# Patient Record
Sex: Male | Born: 1965 | Race: White | Hispanic: No | Marital: Single | Smoking: Current every day smoker
Health system: Southern US, Community
[De-identification: ages and names within clinical notes are randomized; demographics above are authoritative.]

## PROBLEM LIST (undated history)

## (undated) DIAGNOSIS — F329 Major depressive disorder, single episode, unspecified: Secondary | ICD-10-CM

## (undated) DIAGNOSIS — F32A Depression, unspecified: Secondary | ICD-10-CM

## (undated) HISTORY — PX: TOE SURGERY: SHX1073

## (undated) HISTORY — PX: HAND SURGERY: SHX662

---

## 2016-04-07 ENCOUNTER — Emergency Department (HOSPITAL_COMMUNITY)
Admission: EM | Admit: 2016-04-07 | Discharge: 2016-04-07 | Disposition: A | Discharge status: Home / Self Care | Payer: Self-pay | Attending: Emergency Medicine | Admitting: Emergency Medicine

## 2016-04-07 ENCOUNTER — Encounter (HOSPITAL_COMMUNITY): Payer: Self-pay

## 2016-04-07 ENCOUNTER — Observation Stay (HOSPITAL_COMMUNITY)
Admission: RE | Admit: 2016-04-07 | Discharge: 2016-04-09 | Disposition: A | Discharge status: Home / Self Care | Payer: Medicare Other | Source: Intra-hospital | Attending: Psychiatry | Admitting: Psychiatry

## 2016-04-07 DIAGNOSIS — Z79899 Other long term (current) drug therapy: Secondary | ICD-10-CM | POA: Diagnosis not present

## 2016-04-07 DIAGNOSIS — F17210 Nicotine dependence, cigarettes, uncomplicated: Secondary | ICD-10-CM | POA: Insufficient documentation

## 2016-04-07 DIAGNOSIS — F17200 Nicotine dependence, unspecified, uncomplicated: Secondary | ICD-10-CM | POA: Insufficient documentation

## 2016-04-07 DIAGNOSIS — F259 Schizoaffective disorder, unspecified: Secondary | ICD-10-CM

## 2016-04-07 DIAGNOSIS — F250 Schizoaffective disorder, bipolar type: Secondary | ICD-10-CM | POA: Diagnosis not present

## 2016-04-07 DIAGNOSIS — F1490 Cocaine use, unspecified, uncomplicated: Secondary | ICD-10-CM | POA: Insufficient documentation

## 2016-04-07 DIAGNOSIS — F4321 Adjustment disorder with depressed mood: Secondary | ICD-10-CM | POA: Diagnosis not present

## 2016-04-07 DIAGNOSIS — F1290 Cannabis use, unspecified, uncomplicated: Secondary | ICD-10-CM | POA: Insufficient documentation

## 2016-04-07 DIAGNOSIS — F419 Anxiety disorder, unspecified: Secondary | ICD-10-CM | POA: Insufficient documentation

## 2016-04-07 HISTORY — DX: Depression, unspecified: F32.A

## 2016-04-07 HISTORY — DX: Major depressive disorder, single episode, unspecified: F32.9

## 2016-04-07 MED ORDER — HYDROXYZINE HCL 50 MG PO TABS
50.0000 mg | ORAL_TABLET | Freq: Four times a day (QID) | ORAL | Status: DC | PRN
  Administered 2016-04-08 (×2): 50 mg via ORAL
  Filled 2016-04-07 (×2): qty 1

## 2016-04-07 MED ORDER — TRAZODONE HCL 50 MG PO TABS
50.0000 mg | ORAL_TABLET | Freq: Every evening | ORAL | Status: DC | PRN
  Administered 2016-04-08 (×2): 50 mg via ORAL
  Filled 2016-04-07 (×2): qty 1

## 2016-04-07 MED ORDER — ACETAMINOPHEN 325 MG PO TABS: 650.0000 mg | ORAL_TABLET | Freq: Four times a day (QID) | ORAL | Status: DC | PRN

## 2016-04-07 MED ORDER — ALUM & MAG HYDROXIDE-SIMETH 200-200-20 MG/5ML PO SUSP: 30.0000 mL | ORAL | Status: DC | PRN

## 2016-04-07 MED ORDER — MAGNESIUM HYDROXIDE 400 MG/5ML PO SUSP: 30.0000 mL | Freq: Every day | ORAL | Status: DC | PRN

## 2016-04-07 NOTE — BH Assessment (Signed)
Tele Assessment Note   Tyrone Ballard is an 50 y.o. male who [facility]. 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 Pt reports he "talks to himself" when others are not in the room but pt reports he does not have hallucinations. During the assessment, pt continued to experience a flight of ideas as he talked about wanting to find a wife or a girlfriend. Pt asked "if I meet a hot girl in the hot tub and my friend is there, why do I want to be there if my friend is there?" Pt continued to speak in rambling thoughts as he stated "I watch porn and I go to gentleman clubs, I like multiple girls" when the pt was asked questions regarding why he came to the hospital today. Pt continued to express a desire to find a girlfriend and stated "I just want my phone to ring so girls can call me."  Pt states his nephew was shot this year and when asked about H/I pt stated "sometimes I think about it but I don't know if I am serious." Pt denies prior inpatient therapy and states he is not currently seeing an OPT therapist but reports he has in the past [place]. Pt denies regular drug use and states he occassionally smokes marijuana and drinks beers. 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   Per Donell SievertSpencer Simon, PA pt meets criteria for OBS.   Diagnosis: Schizoaffective   Past Medical History:  Past Medical History:  Diagnosis Date   Depression     Past Surgical History:  Procedure Laterality Date   HAND SURGERY     TOE SURGERY      Family  History: No family history on file.  Social History:  reports that he has been smoking.  He has never used smokeless tobacco. He reports that he drinks alcohol. He reports that he uses drugs, including Marijuana and Cocaine.  Additional Social History:  Alcohol / Drug Use Pain Medications: Pt denies abuse  Prescriptions: Pt denies abuse  Over the Counter: Pt denies abuse  History of alcohol / drug use?: Yes Longest period of sobriety (when/how long): (P) unknown Substance #1 Name of Substance 1: Alcohol 1 - Age of First Use: pt reports "I had my first beer when the yankees won the championship" 1 - Amount (size/oz): pt reports "1 beer" 1 - Frequency: "not often" 1 - Duration: unknown 1 - Last Use / Amount: pt reports "last week" Substance #2 Name of Substance 2: Marijuana 2 - Age of First Use: pt stated "as an adult" 2 - Amount (size/oz): "1 or 2 puffs, I can smoke a blunt with my niece and nephew." 2 - Frequency: unknown 2 - Duration: unknown 2 - Last Use / Amount: "last Monday"  CIWA:   COWS:    PATIENT STRENGTHS: (choose at least two) Active sense of humor Financial means Supportive family/friends  Allergies: No Known Allergies  Home Medications:  No prescriptions prior to admission.    OB/GYN Status:  No LMP for male patient.  General Assessment Data Location of Assessment: [facility] Assessment Services TTS Assessment: In system Is this a Tele or Face-to-Face Assessment?: Face-to-Face Is this an Initial Assessment or a Re-assessment for this encounter?: Initial Assessment Marital status: Single Is patient pregnant?: No Pregnancy Status: No Living Arrangements: Parent, Other relatives Can pt return to current living arrangement?: Yes Admission Status: Voluntary Is patient capable of signing voluntary admission?: Yes Referral Source: Self/Family/Friend Insurance type: Medicare  Medical Screening Exam [facility] Walk-in ONLY) Medical Exam completed: Yes  Crisis Care  Plan Living Arrangements: Parent, Other relatives Legal Guardian:  (unknown, pt experiencing flight of ideas) Name of Psychiatrist: none provided, pt states he had one in [place] in 2015 Name of Therapist: none  Education Status Is patient currently in school?: No Highest grade of school patient has completed: 12th  Risk to self with the past 6 months Suicidal Ideation: No Has patient been a risk to self within the past 6 months prior to admission? : No Suicidal Intent: No Has patient had any suicidal intent within the past 6 months prior to admission? : No Is patient at risk for suicide?: No Suicidal Plan?: No Has patient had any suicidal plan within the past 6 months prior to admission? : No Access to Means: No What has been your use of drugs/alcohol within the last 12 months?: pt reports last week he used marijuana and drank a beer Previous Attempts/Gestures: No Triggers for Past Attempts: None known Intentional Self Injurious Behavior: Cutting Comment - Self Injurious Behavior: pt reports he used to be a cutter "years ago" Family Suicide History: Unknown Recent stressful life event(s): Other (Comment) (pt reports he "wants a wife") Persecutory voices/beliefs?: No Depression: No Depression Symptoms:  (denies) Substance abuse history and/or treatment for substance abuse?: No Suicide prevention information given to non-admitted patients: Not applicable  Risk to Others within the past 6 months Homicidal Ideation: No Does patient have any lifetime risk of violence toward others beyond the six months prior to admission? : No Thoughts of Harm to Others: No Current Homicidal Intent: No Current Homicidal Plan: No Access to Homicidal Means: No History of harm to others?: No Assessment of Violence: None Noted Does patient have access to weapons?: No Criminal Charges Pending?: No Does patient have a court date: No Is patient on probation?: No  Psychosis Hallucinations: None  noted Delusions: Unspecified  Mental Status Report Appearance/Hygiene: Bizarre, Disheveled Eye Contact: Good Motor Activity: Freedom of movement, Hyperactivity Speech: Loud, Rapid, Word salad, Tangential Level of Consciousness: Alert Mood: Pleasant, Anxious (pt was kind and using polite terms including "thank you") Affect: Anxious (pt continued to talk about "wanting a girlfriend/wife") Anxiety Level: Minimal Thought Processes: Flight of Ideas, Tangential, Irrelevant Judgement: Impaired Orientation: Time, Place, Person Obsessive Compulsive Thoughts/Behaviors: None  Cognitive Functioning Concentration: Decreased Memory: Recent Intact, Remote Impaired IQ:  (UTA) Insight: Poor Impulse Control: Fair Appetite: Good Sleep: No Change Total Hours of Sleep: 8 Vegetative Symptoms: None  ADLScreening [facility] Assessment Services) Patient's cognitive ability adequate to safely complete daily activities?: Yes Patient able to express need for assistance with ADLs?: Yes Independently performs ADLs?: Yes (appropriate for developmental age)  Prior Inpatient Therapy Prior Inpatient Therapy: No  Prior Outpatient Therapy Prior Outpatient Therapy: Yes Prior Therapy Dates: 2015 Prior Therapy Facilty/Provider(s): unable to recall, pt states it was in [place] Reason for Treatment: unknown Does patient have an ACCT team?: No Does patient have Intensive In-House Services?  : No Does patient have [place] services? :  No Does patient have P4CC services?: No  ADL Screening (condition at time of admission) Patient's cognitive ability adequate to safely complete daily activities?: Yes Is the patient deaf or have difficulty hearing?: No Does the patient have difficulty seeing, even when wearing glasses/contacts?: No Does the patient have difficulty concentrating, remembering, or making decisions?: No Patient able to express need for assistance with ADLs?: Yes Does the patient have difficulty dressing  or bathing?: No Independently performs ADLs?: Yes (appropriate for developmental age) Does the patient have difficulty walking or climbing stairs?: No Weakness of Legs: None Weakness of Arms/Hands: None  Home Assistive Devices/Equipment Home Assistive Devices/Equipment: None    Abuse/Neglect Assessment (Assessment to be complete while patient is alone) Physical Abuse: Denies Verbal Abuse: Denies Sexual Abuse: Denies Exploitation of patient/patient's resources: Denies Self-Neglect: Denies     Merchant navy officer (For Healthcare) Does patient have an advance directive?: No Would patient like information on creating an advanced directive?: No - patient declined information    Additional Information 1:1 In Past 12 Months?: No CIRT Risk: No Elopement Risk: No     Disposition:  Disposition Initial Assessment Completed for this Encounter: Yes Disposition of Patient: Other dispositions Other disposition(s):  (OBS per Donell Sievert, PA )  Karolee Ohs 04/07/2016 9:00 PM

## 2016-04-07 NOTE — Progress Notes (Signed)
Pt admitted to Obs bed 4.  Pt is a walk-in voluntarily.  Pt has flight of ideas almost always centering on getting a wife or girlfriend.  Pt will shift in mid thought to sports, especially the yankees.  Pt describes where he wants to meet girls.  Pt goes to strip clubs and watches porn.  Pt denies SI, HI and AVH.  Pt sts he is not in any pain and sts he is only in obs because "they couldn't find a bed for me".  Pt is hungry and anxious for interview to be completed so he can watch the game.  Pt denies dependence on drugs, alcohol or tobacco.  Pt says he "might" smoke 2 to 3 cigarettes per day and refuses the patch or cessation information.  Pt is concerned about not getting an early discharge in the am.   Pt given a sandwich and drink. Pt is watching TV in bed and is safe on unit.

## 2016-04-07 NOTE — ED Triage Notes (Signed)
Pt states "flipping out over females".  Pt denies SI/HI.  Pt states "flipping out about sex". Rough year due to deaths in family.  Pt wants to talk to someone he can trust to help him out.  Pt not answering directly what we can do for him.  Pt anxious about situation with others.

## 2016-04-07 NOTE — H&P (Signed)
Behavioral Health Medical Screening Exam  Duaine DredgeFrank JAMES Jiles GarterDeluca is an 50 y.o. male.  Total Time spent with patient: 20 minutes  Psychiatric Specialty Exam: Physical Exam  Constitutional: He is oriented to person, place, and time. He appears well-developed.  HENT:  Head: Normocephalic.  Eyes: Pupils are equal, round, and reactive to light.  Respiratory: Effort normal.  Neurological: He is alert and oriented to person, place, and time. No cranial nerve deficit.  Skin: Skin is warm and dry.    Review of Systems  Psychiatric/Behavioral: Positive for depression. Negative for hallucinations, substance abuse and suicidal ideas. The patient is nervous/anxious. The patient does not have insomnia.   All other systems reviewed and are negative.   There were no vitals taken for this visit.There is no height or weight on file to calculate BMI.  General Appearance: Casual  Eye Contact:  Good  Speech:  Clear and Coherent  Volume:  Increased  Mood:  dysthymic  Affect:  Congruent  Thought Process:  Irrelevant  Orientation:  Full (Time, Place, and Person)  Thought Content:  flight of ideas  Suicidal Thoughts:  No  Homicidal Thoughts:  No  Memory:  Immediate;   Fair  Judgement:  Poor  Insight:  Lacking  Psychomotor Activity:  Negative  Concentration: Concentration: Fair  Recall:  Fis[employer]Fair  Fund of Knowledge:Poor  Language: Fair  Akathisia:  Negative  Handed:  Right  AIMS (if indicated):     Assets:  Social Support  Sleep:       Musculoskeletal: Strength & Muscle Tone: within normal limits Gait & Station: normal Patient leans: N/A  There were no vitals taken for this visit.  Recommendations:  Based on my evaluation the patient does not appear to have an emergency medical condition. Patient will be admitted to the observation Unit due to lack of transportation home. He is not meeting IP criteria for admission. Patient with Developmental disorder.  SIMON,SPENCER E, PA-C 04/07/2016, 8:38  PM

## 2016-04-07 NOTE — ED Notes (Signed)
PT DISCHARGED. INSTRUCTIONS GIVEN. AAOX4. PT IN NO APPARENT DISTRESS OR PAIN. THE OPPORTUNITY TO ASK QUESTIONS WAS PROVIDED. 

## 2016-04-07 NOTE — ED Provider Notes (Signed)
[facility]-EMERGENCY DEPT Provider Note   CSN: [id number] Arrival date & time: 04/07/16  1117  By signing my name below, I, Vista Minkobert Ross, attest that this documentation has been prepared under the direction and in the presence of Teressa LowerVrinda Zaliah Wissner NP.  Electronically Signed: Vista Minkobert Ross, ED Scribe. 04/07/16. 1:27 PM.   History   Chief Complaint Chief Complaint  Patient presents with  . Anxiety    HPI HPI Comments: Tyrone Ballard is a 50 y.o. male with a Hx of depression, who presents to the Emergency Department complaining of anxiety that has been persistent for an unspecified amount of time. Pt states that he has been crying a lot recently because he wants to "find someone special in my life". Pt's nephew was killed 7 months ago and can't stop thinking about it. He believes that if he finds a girlfriend that he won't think about his nephew as much. Pt also reports that his mother has been yelling at him a lot recently. Per triage note, pt states that he wants to talk to someone that he can trust. He denies any current medications but used to take a medication to help him "calm down". He does not have a current PCP. Pt denies any SI.   The history is provided by the patient. No language interpreter was used.    Past Medical History:  Diagnosis Date  . Depression     There are no active problems to display for this patient.   Past Surgical History:  Procedure Laterality Date  . HAND SURGERY    . TOE SURGERY         Home Medications    Prior to Admission medications   Not on File    Family History History reviewed. No pertinent family history.  Social History Social History  Substance Use Topics  . Smoking status: Current Every Day Smoker  . Smokeless tobacco: Never Used  . Alcohol use Yes     Comment: social     Allergies   Review of patient's allergies indicates no known allergies.   Review of Systems Review of Systems  Constitutional: Negative for fever.    Psychiatric/Behavioral: Negative for self-injury and suicidal ideas. The patient is nervous/anxious.   All other systems reviewed and are negative.    Physical Exam Updated Vital Signs BP 136/89 (BP Location: Left Arm)   Pulse 85   Temp 98.5 F (36.9 C) (Oral)   Resp 18   SpO2 97%   Physical Exam  Constitutional: He is oriented to person, place, and time. He appears well-developed and well-nourished. No distress.  HENT:  Head: Normocephalic and atraumatic.  Neck: Normal range of motion.  Pulmonary/Chest: Effort normal.  Neurological: He is alert and oriented to person, place, and time.  Skin: Skin is warm and dry. He is not diaphoretic.  Psychiatric: He has a normal mood and affect. Judgment normal.  Flight of ideas  Nursing note and vitals reviewed.   ED Treatments / Results  DIAGNOSTIC STUDIES: Oxygen Saturation is 97% on RA, normal by my interpretation.  COORDINATION OF CARE: 1:17 PM-Will discharge. Discussed treatment plan with pt at bedside and pt agreed to plan.   Labs (all labs ordered are listed, but only abnormal results are displayed) Labs Reviewed - No data to display  EKG  EKG Interpretation None       Radiology No results found.  Procedures Procedures (including critical care time)  Medications Ordered in ED Medications - No data to display  Initial Impression / Assessment and Plan / ED Course  I have reviewed the triage vital signs and the nursing notes.  Pertinent labs & imaging results that were available during my care of the patient were reviewed by me and considered in my medical decision making (see chart for details).  Clinical Course   Pt not si/hi: given resources Final Clinical Impressions(s) / ED Diagnoses   Final diagnoses:  None    New Prescriptions New Prescriptions   No medications on file  I personally performed the services described in this documentation, which was scribed in my presence. The recorded  information has been reviewed and is accurate.     Teressa Lower, NP 04/07/16 1440    Mancel Bale, MD 04/07/16 [phone]

## 2016-04-07 NOTE — Progress Notes (Signed)
Patient ID: Cala BradfordFrank JAMES Codner, male   DOB: Nov 12, 1965, 50 y.o.   MRN: [id number] Per State regulations 482.30 this chart was reviewed for medical necessity with respect to the patient's admission/duration of stay.    Next review date: 04/09/16  Thurman CoyerEric Mertice Uffelman, BSN, RN-BC  Case Manager

## 2016-04-08 DIAGNOSIS — F250 Schizoaffective disorder, bipolar type: Principal | ICD-10-CM

## 2016-04-08 DIAGNOSIS — F259 Schizoaffective disorder, unspecified: Secondary | ICD-10-CM

## 2016-04-08 LAB — COMPREHENSIVE METABOLIC PANEL
ALBUMIN: 4.1 g/dL (ref 3.5–5.0)
ALK PHOS: 46 U/L (ref 38–126)
ALT: 21 U/L (ref 17–63)
ANION GAP: 7 (ref 5–15)
AST: 34 U/L (ref 15–41)
BILIRUBIN TOTAL: 0.6 mg/dL (ref 0.3–1.2)
BUN: 25 mg/dL — ABNORMAL HIGH (ref 6–20)
CALCIUM: 9.5 mg/dL (ref 8.9–10.3)
CO2: 29 mmol/L (ref 22–32)
Chloride: 105 mmol/L (ref 101–111)
Creatinine, Ser: 1.04 mg/dL (ref 0.61–1.24)
GLUCOSE: 80 mg/dL (ref 65–99)
Potassium: 4.4 mmol/L (ref 3.5–5.1)
Sodium: 141 mmol/L (ref 135–145)
TOTAL PROTEIN: 7.2 g/dL (ref 6.5–8.1)

## 2016-04-08 LAB — RAPID URINE DRUG SCREEN, HOSP PERFORMED
AMPHETAMINES: NOT DETECTED
BENZODIAZEPINES: NOT DETECTED
Barbiturates: NOT DETECTED
COCAINE: NOT DETECTED
OPIATES: NOT DETECTED
TETRAHYDROCANNABINOL: NOT DETECTED

## 2016-04-08 LAB — CBC
HCT: 40.5 % (ref 39.0–52.0)
HEMOGLOBIN: 13.7 g/dL (ref 13.0–17.0)
MCH: 28.6 pg (ref 26.0–34.0)
MCHC: 33.8 g/dL (ref 30.0–36.0)
MCV: 84.6 fL (ref 78.0–100.0)
Platelets: 218 10*3/uL (ref 150–400)
RBC: 4.79 MIL/uL (ref 4.22–5.81)
RDW: 13.8 % (ref 11.5–15.5)
WBC: 6.1 10*3/uL (ref 4.0–10.5)

## 2016-04-08 MED ORDER — RISPERIDONE 0.5 MG PO TABS
0.5000 mg | ORAL_TABLET | Freq: Two times a day (BID) | ORAL | Status: DC
  Administered 2016-04-08 – 2016-04-09 (×3): 0.5 mg via ORAL
  Filled 2016-04-08 (×3): qty 1

## 2016-04-08 MED ORDER — HYDROXYZINE HCL 25 MG PO TABS: 25.0000 mg | ORAL_TABLET | Freq: Four times a day (QID) | ORAL | Status: DC | PRN

## 2016-04-08 NOTE — Discharge Instructions (Signed)
Pt will need to follow up with Dr. Mervyn SkeetersA at [facility] [place] to ensure continuity of care. Pt should receive a telephone call back from this office within the next 5-7 days post discharge.

## 2016-04-08 NOTE — Progress Notes (Signed)
D: Pt awake and oriented with an anxious mood. He denies SI/HI/AVH. No self injurious behaviors noted or reported. A: Emotional support provided. Encouraged pt to seek assistance with any needs or concerns. R: Safety maintained on unit.

## 2016-04-08 NOTE — [facility] Counselor (Addendum)
This Probation officer spoke with pt in regards to developing a discharge plan. This Probation officer met with NP Elmarie Shiley, who made a suggestion that this pt be referred out to an OPT provider for follow up with medication management and therapy. This Probation officer made referral on this pt with [facility], located on [address] in [place], [place]. This Probation officer spoke with Helene Kelp who recorded pt's demographic information along with pt's emergency contact information [facility]) mom so she could call and schedule a follow up appointment. This Probation officer was informed that pt should have an appointment scheduled within the next 5-7 days depending on the amount of referrals that they receive. This write was also asked to fax over recent medical record information on behalf of the pt in an attempt to expedite the referral process. Pt was receptive of this information.

## 2016-04-08 NOTE — Progress Notes (Signed)
Pt resting in bed 4 watching TV.  Pt denies any pain, discomfort, SI, HI, AVH.  Pt appears calm and sts his understanding of why he is here an extra day is medication management and observation.  Pt is friendly and talkative.  Pt sts he understands that he talks a great deal about girls.  Pt sts that he understands when he speaks about girls out loud that it makes others uncomfortable.  Pt asks for snacks and a drink Pt provided snacks and drink Pt continuously observed on unit for safety except when in the bathroom.

## 2016-04-08 NOTE — H&P (Signed)
BH Observation Unit Provider Admission PAA/H&P  Patient Identification: Tyrone Ballard MRN:  [id number] Date of Evaluation:  04/08/2016 Chief Complaint:  Patient states "I need medications so I will not talk to myself in public."  Principal Diagnosis: Schizoaffective disorder (HCC) bipolar type  Diagnosis:   Patient Active Problem List   Diagnosis Date Noted  . Schizoaffective disorder (HCC) [F25.9] 04/08/2016  . Situational depression [F43.21] 04/07/2016   History of Present Illness:   Per initial assessment on evening of 04/07/2016:   Tyrone ComingsFrank Ballard is an 50 y.o. male who [facility] voluntarily. 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 Pt reports he "talks to himself" when others are not in the room but pt reports he does not have hallucinations. During the assessment, pt continued to experience a flight of ideas as he talked about wanting to find a wife or a girlfriend. Pt asked "if I meet a hot girl in the hot tub and my friend is there, why do I want to be there if my friend is there?" Pt continued to speak in rambling thoughts as he stated "I watch porn and I go to gentleman clubs, I like multiple girls" when the pt was asked questions regarding why he came to the hospital today. Pt continued to express a desire to find a girlfriend and stated "I just want my phone to ring so girls can call me."  Pt states his nephew was shot this year and when asked about H/I pt stated "sometimes I think about it but I don't know if I am serious." Pt denies prior inpatient therapy and states he is not currently seeing an OPT therapist but reports he has in the past in 2015 [place] in [place]. Pt denies regular drug use and states he occassionally smokes marijuana and drinks beers. 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   During assessment of 04/08/2016 patient continues to have disorganized thought processes. He is noted to be a very poor historian as he focuses on "not being able to function well enough to have a girlfriend. I think medicine would help with that." Patient provided consent for Observation staff to contact his mother for collateral information. The patient appears manic during assessment today. His mother reports that patient previously took Risperdal for history of schizophrenia. She reported that patient has not taken any medications for several years. Patient denies suicidal or homicidal ideation. He admits to being paranoid but due to his thought processes was difficult to assess. Patient wanting to be discharged tomorrow after being monitored after having medication re-started. He denies any substance abuse but no current lab-work is available. Patient will be ordered for labs this evening. [place] and does not have an current outpatient follow up. He does reports symptoms of talking to self "but I'm not hearing voices right now."   Associated Signs/Symptoms: Depression Symptoms:  Denies (Hypo) Manic Symptoms:  Distractibility, Elevated Mood, Flight of Ideas, Labiality of Mood, Anxiety Symptoms:  Excessive Worry, Psychotic Symptoms:  Paranoia, PTSD  Symptoms: Negative Total Time spent with patient: 30 minutes  Past Psychiatric History: Schizoaffective   Is the patient at risk to self? No.  Has the patient been a risk to self in the past 6 months? No.  Has the patient been a risk to self within the distant past? No.  Is the patient a risk to others? No.  Has the patient been a risk to others in the past 6 months? No.   Has the patient been a risk to others within the distant past? No.   Prior Inpatient Therapy: Prior Inpatient Therapy: No Prior Outpatient Therapy: Prior Outpatient Therapy: Yes Prior Therapy Dates: 2015 Prior Therapy Facilty/Provider(s): unable to recall, pt states it was in [place] Reason for Treatment: unknown Does patient have an ACCT team?: No Does patient have Intensive In-House Services?  : No Does patient have [facility] services? : No Does patient have P4CC services?: No  Alcohol Screening: 1. How often do you have a drink containing alcohol?: Monthly or less 2. How many drinks containing alcohol do you have on a typical day when you are drinking?: 1 or 2 3. How often do you have six or more drinks on one occasion?: Never Preliminary Score: 0 9. Have you or someone else been injured as a result of your drinking?: No 10. Has a relative or friend or a doctor or another health worker been concerned about your drinking or suggested you cut down?: No Alcohol Use Disorder Identification Test Final Score (AUDIT): 1 Brief Intervention: AUDIT score less than 7 or less-screening does not suggest unhealthy drinking-brief intervention not indicated Substance Abuse History in the last 12 months:  Yes reports use of marijuana at least weekly  Consequences of Substance Abuse: Negative Previous Psychotropic Medications: Yes  Psychological Evaluations: No  Past Medical History:  Past Medical History:  Diagnosis Date  . Depression     Past Surgical History:  Procedure Laterality Date  . HAND SURGERY    . TOE SURGERY     Family History: History reviewed. No pertinent family history. Family Psychiatric History: Denies Tobacco Screening: Have you used any form of tobacco in the last 30 days? (Cigarettes, Smokeless Tobacco, Cigars, and/or Pipes): Yes Tobacco use, Select all that apply: 4 or less cigarettes per day Are you interested in Tobacco Cessation Medications?: No, patient  refused Counseled patient on smoking cessation including recognizing danger situations, developing coping skills and basic information about quitting provided: Refused/Declined practical counseling Social History:  History  Alcohol Use  . Yes    Comment: social     History  Drug Use  . Types: Marijuana, Cocaine    Comment: social only    Additional Social History: Marital status: Single    Pain Medications: Pt denies abuse  Prescriptions: Pt denies abuse  Over the Counter: Pt denies abuse  History of alcohol / drug use?: Yes Longest period of sobriety (when/how long): (P) unknown Name of Substance 1: Alcohol 1 - Age of First Use: pt reports "I had my first beer when the [place]ankees won the championship" 1 - Amount (size/oz): pt reports "1 beer" 1 - Frequency: "not often" 1 - Duration: unknown 1 - Last Use / Amount: pt reports "last week" Name of Substance 2: Marijuana 2 - Age of First Use: pt stated "as an adult" 2 - Amount (size/oz): "1 or 2 puffs, I can smoke a blunt with my niece and nephew." 2 - Frequency: unknown 2 - Duration: unknown 2 - Last Use / Amount: "  last Monday"                Allergies:  No Known Allergies Lab Results: No results found for this or any previous visit (from the past 48 hour(s)).  Blood Alcohol level:  No results found for: [facility]  Metabolic Disorder Labs:  No results found for: HGBA1C, MPG No results found for: PROLACTIN No results found for: CHOL, TRIG, HDL, CHOLHDL, VLDL, LDLCALC  Current Medications: Current Facility-Administered Medications  Medication Dose Route Frequency Provider Last Rate Last Dose  . acetaminophen (TYLENOL) tablet 650 mg  650 mg Oral Q6H PRN Kerry Hough, PA-C      . alum & mag hydroxide-simeth (MAALOX/MYLANTA) 200-200-20 MG/5ML suspension 30 mL  30 mL Oral Q4H PRN Kerry Hough, PA-C      . hydrOXYzine (ATARAX/VISTARIL) tablet 50 mg  50 mg Oral Q6H PRN Kerry Hough, PA-C   50 mg at 04/08/16 0936  .  magnesium hydroxide (MILK OF MAGNESIA) suspension 30 mL  30 mL Oral Daily PRN Kerry Hough, PA-C      . risperiDONE (RISPERDAL) tablet 0.5 mg  0.5 mg Oral BID Thermon Leyland, NP   0.5 mg at 04/08/16 0936  . traZODone (DESYREL) tablet 50 mg  50 mg Oral QHS,MR X 1 Spencer E Simon, PA-C       PTA Medications: No prescriptions prior to admission.    Musculoskeletal: Strength & Muscle Tone: within normal limits Gait & Station: normal Patient leans: N/A  Psychiatric Specialty Exam: Physical Exam  Review of Systems  Constitutional: Negative.   HENT: Negative.   Eyes: Negative.   Respiratory: Negative.   Cardiovascular: Negative.   Gastrointestinal: Negative.   Genitourinary: Negative.   Musculoskeletal: Negative.   Skin: Negative.   Neurological: Negative.   Endo/Heme/Allergies: Negative.   Psychiatric/Behavioral: Positive for hallucinations and substance abuse. Negative for depression, memory loss and suicidal ideas. The patient is nervous/anxious and has insomnia.     Blood pressure 104/84, pulse 85, temperature 97.7 F (36.5 C), temperature source Oral, resp. rate 18, height 6' 1.62" (1.87 m), weight 88.9 kg (196 lb), SpO2 100 %.Body mass index is 25.42 kg/m.  General Appearance: Casual  Eye Contact:  Good  Speech:  Pressured  Volume:  Normal  Mood:  Euphoric  Affect:  Full Range  Thought Process:  Irrelevant  Orientation:  Full (Time, Place, and Person)  Thought Content:  Paranoid Ideation and Rumination  Suicidal Thoughts:  No  Homicidal Thoughts:  No  Memory:  Immediate;   Fair Recent;   Fair Remote;   Poor  Judgement:  Impaired  Insight:  Shallow  Psychomotor Activity:  Restlessness  Concentration:  Concentration: Fair and Attention Span: Fair  Recall:  [employer] of Knowledge:  Fair  Language:  Fair  Akathisia:  No  Handed:  Right  AIMS (if indicated):     Assets:  Communication Skills Desire for Improvement Financial  Resources/Insurance Housing Intimacy Physical Health Resilience Social Support  ADL's:  Intact  Cognition:  WNL  Sleep:         Treatment Plan Summary: Daily contact with patient to assess and evaluate symptoms and progress in treatment and Medication management  Observation Level/Precautions:  Continuous Observation Laboratory:  CBC Chemistry Profile UDS Psychotherapy: Individual  Medications:  Start Risperdal 0.5 mg BID for mood stabilization/psychosis  Consultations:  None Discharge Concerns:  Compliance with medication regimen  Estimated LOS: 24-48 hours Other:  Observation Counselor to assist with establishing  outpatient follow up after discharge     Fransisca Kaufmann, NP-C 9/26/20171:48 PM   Agree with NP note and assessment as above

## 2016-04-08 NOTE — Progress Notes (Addendum)
Patient anxious, walking around unit and talking incessantly.  Paitent gave written/verbal consent to talk to his mother relating his treatment/plan of care.  Mother states that patient previously took Risperdal, Lisinopril and Zocor. She did not know dosage of meds and states that patient had not taken these meds for a few years.  She also reported that he has a history of schizophrenia. Patient given prn Hydroxyzine for anxiety; also given scheduled Risperidone. He continues to deny suicidal and homicidal ideation.

## 2016-04-08 NOTE — Progress Notes (Signed)
Patient currently sleeping

## 2016-04-08 NOTE — Progress Notes (Signed)
[facility] OBSERVATION UNIT:  Family/Significant Other Suicide Prevention Education  Suicide Prevention Education:  Education Completed;  Tyrone Ballard, patients's mother, has been identified by the patient as the family member/significant other with whom the patient will be residing, and identified as the person(s) who will aid the patient in the event of a mental health crisis (suicidal ideations/suicide attempt).  With written consent from the patient, the family member/significant other has been provided the following suicide prevention education, prior to the and/or following the discharge of the patient.  The suicide prevention education provided includes the following:  Suicide risk factors  Suicide prevention and interventions  National Suicide Hotline telephone number  [facility] assessment telephone number  [facility] Emergency Assistance 911  [facility] and/or Residential Mobile Crisis Unit telephone number  Request made of family/significant other to:  Remove weapons (e.g., guns, rifles, knives), all items previously/currently identified as safety concern.    Remove drugs/medications (over-the-counter, prescriptions, illicit drugs), all items previously/currently identified as a safety concern.  The family member/significant other verbalizes understanding of the suicide prevention education information provided.  The family member/significant other agrees to remove the items of safety concern listed above.  Tyrone Ballard 04/08/2016, 3:04 PM

## 2016-04-09 DIAGNOSIS — F250 Schizoaffective disorder, bipolar type: Secondary | ICD-10-CM | POA: Diagnosis not present

## 2016-04-09 MED ORDER — HYDROXYZINE HCL 25 MG PO TABS: 25.0000 mg | ORAL_TABLET | Freq: Four times a day (QID) | ORAL | 0 refills | Status: DC | PRN

## 2016-04-09 MED ORDER — RISPERIDONE 0.5 MG PO TABS: 0.5000 mg | ORAL_TABLET | Freq: Two times a day (BID) | ORAL | 0 refills | Status: DC

## 2016-04-09 MED ORDER — TRAZODONE HCL 50 MG PO TABS: 50.0000 mg | ORAL_TABLET | Freq: Every evening | ORAL | 0 refills | Status: DC | PRN

## 2016-04-09 NOTE — Discharge Planning (Signed)
Written/verbal discharge instructions, prescriptions and follow-up appointment info given to patient with verbalization of understanding;  Patient denies suicidal and homicidal ideation. Suicide Prevention information/materials given to patient  All patient belongings returned to patient at time of discharge. Discharged home in stable condition with family.

## 2016-04-09 NOTE — Discharge Summary (Signed)
Physician Discharge Summary Note  Patient:  Tyrone Ballard is an 50 y.o., male MRN:  [id number] DOB:  12/28/1965 Patient phone:  [phone] (home)  Patient address:   [address] [place] [address],  Total Time spent with patient: 30 minutes  Date of Admission:  04/07/2016 Date of Discharge: 04/09/2016  Reason for Admission:    History of Present Illness:   Per initial assessment on evening of 04/07/2016:   Tyrone Ballard an 50 y.o.malewho presented to [facility] as a walk-in voluntarily. 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 Pt reports he "talks to himself" when others are not in the room but pt reports he does not have hallucinations. During the assessment, pt continued to experience a flight of ideas as he talked about wanting to find a wife or a girlfriend. Pt asked "if I meet a hot girl in the hot tub and my friend is there, why do I want to be there if my friend is there?" Pt continued to speak in rambling thoughts as he stated "I watch porn and I go to gentleman clubs, I like multiple girls" when the pt was asked questions regarding why he came to the hospital today. Pt continued to express a desire to find a girlfriend and stated "I just want my phone to ring so girls can call me."  Pt states his nephew was shot this year and when asked about H/I pt stated "sometimes I think about it but I don't know if I am serious." Pt denies prior inpatient therapy and states he is not currently seeing an OPT therapist but reports he has in the past in 2015 when he lived in [place]. Pt denies regular drug use and states he occassionally smokes marijuana and drinks beers. 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   During assessment of 04/08/2016 patient continues to have disorganized thought processes. He is noted to be a very poor historian as he focuses on "not being able to function well enough to have a girlfriend. I think medicine would help with that." Patient provided consent for Observation staff to contact his mother for collateral information. The patient appears manic during assessment today. His mother reports that patient previously took Risperdal for history of schizophrenia. She reported that patient has not taken any medications for several years. Patient denies suicidal or homicidal ideation. He admits to being paranoid but due to his thought processes was difficult to assess. Patient wanting to be discharged tomorrow after being monitored after having medication re-started. He denies any substance abuse but no current lab-work is available. Patient will be ordered for labs this evening. The patient is from [place] and does not have an current outpatient follow up. He does reports symptoms of talking to self "but I'm not hearing voices right now."   Associated Signs/Symptoms: Depression Symptoms:  Denies (Hypo) Manic Symptoms:  Distractibility, Elevated Mood, Flight of Ideas, Labiality of Mood, Anxiety Symptoms:  Excessive Worry, Psychotic Symptoms:  Paranoia, PTSD Symptoms: Negative Total Time spent with patient: 30 minutes  Past Psychiatric History: Schizoaffective  Principal Problem: Schizoaffective disorder [facility]) Discharge Diagnoses: Patient Active Problem List   Diagnosis Date Noted  . Schizoaffective disorder (HCC) [F25.9] 04/08/2016  . Situational depression [F43.21] 04/07/2016    Past Psychiatric History:None  Past Medical History:  Past Medical History:  Diagnosis Date  . Depression     Past Surgical History:  Procedure  Laterality Date  . HAND SURGERY    . TOE SURGERY     Family History: History reviewed. No pertinent family history. Family Psychiatric  History: See HPI Social History:  History  Alcohol Use  . Yes    Comment: social     History  Drug Use  . Types: Marijuana, Cocaine    Comment: social only    Social History   Social History  . Marital status: Single    Spouse name: N/A  . Number of children: N/A  . Years of education: N/A   Social History Main Topics  . Smoking status: Current Every Day Smoker    Packs/day: 0.25    Years: 3.00    Types: Cigarettes  . Smokeless tobacco: Never Used     Comment: Pt refused and refused patch. sys he smokes mabe 2-4 cigaretts per day  . Alcohol use Yes     Comment: social  . Drug use:     Types: Marijuana, Cocaine     Comment: social only  . Sexual activity: Yes   Other Topics Concern  . None   Social History Narrative  . None    Hospital Course:  On evaluation 04/07/2016:  Patient denied any suicidal ideation or acute psychotic symptoms. He appeared motivated to follow up outpatient and patient received assistance in making these appointments. Rube was found stable for discharge. He was provided with a prescription for his psych medications, which he had stopped. The OBS Counselor scheduled an outpatient  appointment for [facility] [place]. The appointment is scheduled for Sunday October 1st, this will be an intake call and a new appointment will be given. Patient is expected to arrive at the appointment at least 15 minutes early to complete paperwork. He left [facility] in stable condition with all items returned to him.   Patient is expected to bring a copy of his Medicare card.   Patient was admitted to the Observation unit of [facility] under the service of Dr. Lucianne Muss. Safety: Placed in Q15 minutes observation for safety. During the course of this hospitalization patient did not required any change on  her observation and no PRN was required. No major behavioral problems reported during the hospitalization. On initial assessment patient verbalized worsening of anxiety symptoms with increased panic attacks. He mentioned multiple stressors including physical ailments and family dynamic. Patient was able to engage well with peers and staff, adjusted very well to the milieu, and he remained pleasant with brighter affect and able to participate in daily evaluations. He was encouraged to build coping skills to use on his return home. Patient was very pleasant during his interaction with the team, although very anxious at time. Patient agreed to start psychotropic medication since see had a past trial of Wellbutrin and Celexa that he did not work for her. Patient agreed to restart individual and family therapy on his return home.  Patient was able to verbalize reasons for her living and appears to have a positive outlook toward her future. A safety plan was discussed with her. She was provided with national suicide Hotline phone # [phone] as  well as [facility] number. Pt continuously refuted any suicidal ideations or history of previous suicide attempts. She reports feeling uneasy.  General Medical Problems: Patient medically stable and baseline physical exam within normal limits with no abnormal findings. During the hospitalization patient gradually improved as evidenced by: suicidal ideation, homicidal ideation, psychosis, depressive symptoms subsided. He displayed an overall improvement in mood, behavior and affect. He was more cooperative and responded positively to redirections and limits set by the staff. The patient was able to verbalize age appropriate coping methods for use at home and school. On discharge patients denied psychotic symptoms, suicidal/homicidal ideation, intention or plan and there was no evidence of manic or depressive symptoms. Patient was discharge home  on stable condition.   Physical Findings: AIMS: Facial and Oral Movements Muscles of Facial Expression: None, normal Lips and Perioral Area: None, normal Jaw: None, normal Tongue: None, normal,Extremity Movements Upper (arms, wrists, hands, fingers): None, normal Lower (legs, knees, ankles, toes): None, normal, Trunk Movements Neck, shoulders, hips: None, normal, Overall Severity Severity of abnormal movements (highest score from questions above): None, normal Incapacitation due to abnormal movements: None, normal Patient's awareness of abnormal movements (rate only patient's report): No Awareness, Dental Status Current problems with teeth and/or dentures?: No Does patient usually wear dentures?: No  CIWA:  CIWA-Ar Total: 0 COWS:     Musculoskeletal: Strength & Muscle Tone: within normal limits Gait & Station: normal Patient leans: N/A  Psychiatric Specialty Exam: Physical Exam  ROS  Blood pressure 110/73, pulse 93, temperature 98.4 F (36.9 C), resp. rate 16, height 6' 1.62" (1.87 m), weight 88.9 kg (196 lb), SpO2 100 %.Body mass index is 25.42 kg/m.  Sleep:        Have you used any form of tobacco in the last 30 days? (Cigarettes, Smokeless Tobacco, Cigars, and/or Pipes): Yes  Has this patient used any form of tobacco in the last 30 days? (Cigarettes, Smokeless Tobacco, Cigars, and/or Pipes)  No  Blood Alcohol level:  No results found for: [facility]  Metabolic Disorder Labs:  No results found for: HGBA1C, MPG No results found for: PROLACTIN No results found for: CHOL, TRIG, HDL, CHOLHDL, VLDL, LDLCALC  See Psychiatric Specialty Exam and Suicide Risk Assessment completed by Attending Physician prior to discharge.  Discharge destination:  Home  Is patient on multiple antipsychotic therapies at discharge:  No   Has Patient had three or more failed trials of antipsychotic monotherapy by history:  No  Recommended Plan for Multiple Antipsychotic Therapies: NA  Discharge  Instructions    Discharge instructions    Complete by:  As directed    Take all medications as prescribed. Keep all follow-up appointments as scheduled.  Do not consume alcohol or use illegal drugs while on prescription medications. Report any adverse effects from your medications to your primary care provider promptly.  In the event of recurrent symptoms or worsening symptoms, call 911, a crisis hotline, or go to the nearest emergency department for evaluation.       Medication List    TAKE these medications     Indication  hydrOXYzine 25 MG tablet Commonly known as:  ATARAX/VISTARIL Take 1 tablet (25 mg total) by mouth every 6 (six) hours as needed for anxiety.  Indication:  Anxiety Neurosis, Sedation   risperiDONE 0.5 MG tablet Commonly known as:  RISPERDAL Take 1 tablet (0.5 mg total) by mouth 2 (two) times daily.  Indication:  Major Depressive Disorder, Schizoaffective, Bipolar type   traZODone 50  MG tablet Commonly known as:  DESYREL Take 1 tablet (50 mg total) by mouth at bedtime and may repeat dose one time if needed.  Indication:  Trouble Sleeping      Follow-up Information    [facility] Follow up on 04/13/2016.   Why:  Pt will recieve a follow up call within the next 5-7 days in reference to schdeuling a follow up appointment to continue with medication management and therapy sessions. Contact information: [address] [place] [address] [phone]           Follow-up recommendations:  Activity:  increase activity as toelrated Diet:  Regular house diet Tests:  Continue with current recommendations for labs, with outpatient MD.  Other:  Follow up with appointment for MD.    Signed: Truman Haywardakia S Starkes, FNP 04/09/2016, 11:15 AM    Agree with NP note as above

## 2016-04-09 NOTE — Progress Notes (Signed)
D:  Patient cooperative; he appears anxious; he denies suicidal and homicidal ideation and AVH; he reports that he just has a "lot of thoughts going on".  No self-injurious behaviors noted or reported. A:  Emotional support provided; encouraged him to seek assistance with needs/concerns. R:  Safety maintained on unit.

## 2016-04-09 NOTE — Discharge Planning (Signed)
[facility] Discharge [facility] :  Will you be returning to the same living situation after discharge:  Yes,  With Family  At discharge, do you have transportation home?: Yes,  Family  Do you have the ability to pay for your medications: Yes,  Medicaid/Medicare  Release of information consent forms completed and in the chart;  Patient's signature needed at discharge.  Patient to Follow up at: Follow-up Information    [facility] Follow up on 04/13/2016.   Why:  Pt will recieve a follow up call within the next 5-7 days in reference to schdeuling a follow up appointment to continue with medication management and therapy sessions. Contact information: [address] [place] [phone]           Safety Planning and Suicide Prevention discussed: Yes,  Discussed patient and mother with verbalization of understanding   Camelia EngKaren H Halia Franey 04/09/2016, 11:28 AM

## 2016-04-09 NOTE — Progress Notes (Signed)
Patient called family for ride home.

## 2016-04-10 LAB — HEMOGLOBIN A1C
Hgb A1c MFr Bld: 5.3 % (ref 4.8–5.6)
MEAN PLASMA GLUCOSE: 105 mg/dL

## 2020-03-09 ENCOUNTER — Emergency Department (HOSPITAL_COMMUNITY)
Admission: EM | Admit: 2020-03-09 | Discharge: 2020-03-10 | Disposition: A | Discharge status: Home / Self Care | Payer: Medicare Other | Attending: Emergency Medicine | Admitting: Emergency Medicine

## 2020-03-09 ENCOUNTER — Encounter (HOSPITAL_COMMUNITY): Payer: Self-pay

## 2020-03-09 ENCOUNTER — Other Ambulatory Visit: Payer: Self-pay

## 2020-03-09 DIAGNOSIS — R1011 Right upper quadrant pain: Secondary | ICD-10-CM | POA: Diagnosis not present

## 2020-03-09 DIAGNOSIS — R000 Tachycardia, unspecified: Secondary | ICD-10-CM | POA: Insufficient documentation

## 2020-03-09 DIAGNOSIS — F17210 Nicotine dependence, cigarettes, uncomplicated: Secondary | ICD-10-CM | POA: Insufficient documentation

## 2020-03-09 DIAGNOSIS — R112 Nausea with vomiting, unspecified: Secondary | ICD-10-CM

## 2020-03-09 DIAGNOSIS — R1013 Epigastric pain: Secondary | ICD-10-CM | POA: Diagnosis present

## 2020-03-09 DIAGNOSIS — R1110 Vomiting, unspecified: Secondary | ICD-10-CM | POA: Insufficient documentation

## 2020-03-09 LAB — COMPREHENSIVE METABOLIC PANEL
ALT: 122 U/L — ABNORMAL HIGH (ref 0–44)
AST: 89 U/L — ABNORMAL HIGH (ref 15–41)
Albumin: 4.3 g/dL (ref 3.5–5.0)
Alkaline Phosphatase: 51 U/L (ref 38–126)
Anion gap: 10 (ref 5–15)
BUN: 9 mg/dL (ref 6–20)
CO2: 23 mmol/L (ref 22–32)
Calcium: 9.5 mg/dL (ref 8.9–10.3)
Chloride: 105 mmol/L (ref 98–111)
Creatinine, Ser: 0.91 mg/dL (ref 0.61–1.24)
GFR calc Af Amer: >60 mL/min (ref >60)
GFR calc non Af Amer: >60 mL/min (ref >60)
Glucose, Bld: 116 mg/dL — ABNORMAL HIGH (ref 70–99)
Potassium: 4.2 mmol/L (ref 3.5–5.1)
Sodium: 138 mmol/L (ref 135–145)
Total Bilirubin: 0.7 mg/dL (ref 0.3–1.2)
Total Protein: 7.9 g/dL (ref 6.5–8.1)

## 2020-03-09 LAB — URINALYSIS, ROUTINE W REFLEX MICROSCOPIC
Bilirubin Urine: NEGATIVE
Glucose, UA: NEGATIVE mg/dL
Hgb urine dipstick: NEGATIVE
Ketones, ur: 20 mg/dL — AB
Leukocytes,Ua: NEGATIVE
Nitrite: NEGATIVE
Protein, ur: 100 mg/dL — AB
Specific Gravity, Urine: 1.027 (ref 1.005–1.030)
pH: 5 (ref 5.0–8.0)

## 2020-03-09 LAB — CBC
HCT: 50.3 % (ref 39.0–52.0)
Hemoglobin: 16.4 g/dL (ref 13.0–17.0)
MCH: 28.4 pg (ref 26.0–34.0)
MCHC: 32.6 g/dL (ref 30.0–36.0)
MCV: 87 fL (ref 80.0–100.0)
Platelets: 243 10*3/uL (ref 150–400)
RBC: 5.78 MIL/uL (ref 4.22–5.81)
RDW: 13.4 % (ref 11.5–15.5)
WBC: 6 10*3/uL (ref 4.0–10.5)
nRBC: 0 % (ref 0.0–0.2)

## 2020-03-09 LAB — LIPASE, BLOOD: Lipase: 40 U/L (ref 11–51)

## 2020-03-09 NOTE — ED Triage Notes (Signed)
Pt reports generalized abd pain and vomiting for months.

## 2020-03-10 ENCOUNTER — Emergency Department (HOSPITAL_COMMUNITY): Payer: Medicare Other

## 2020-03-10 ENCOUNTER — Telehealth: Payer: Self-pay

## 2020-03-10 DIAGNOSIS — R1011 Right upper quadrant pain: Secondary | ICD-10-CM | POA: Diagnosis not present

## 2020-03-10 MED ORDER — SODIUM CHLORIDE 0.9 % IV BOLUS (SEPSIS)
1000.0000 mL | Freq: Once | INTRAVENOUS | Status: AC
  Administered 2020-03-10: 1000 mL via INTRAVENOUS

## 2020-03-10 MED ORDER — ONDANSETRON 4 MG PO TBDP: 4.0000 mg | ORAL_TABLET | Freq: Three times a day (TID) | ORAL | 0 refills | Status: AC | PRN

## 2020-03-10 MED ORDER — FENTANYL CITRATE (PF) 100 MCG/2ML IJ SOLN
50.0000 ug | Freq: Once | INTRAMUSCULAR | Status: AC
  Administered 2020-03-10: 50 ug via INTRAVENOUS
  Filled 2020-03-10: qty 2

## 2020-03-10 MED ORDER — ONDANSETRON HCL 4 MG/2ML IJ SOLN
4.0000 mg | Freq: Once | INTRAMUSCULAR | Status: AC
  Administered 2020-03-10: 4 mg via INTRAVENOUS
  Filled 2020-03-10: qty 2

## 2020-03-10 MED ORDER — IOHEXOL 300 MG/ML  SOLN
125.0000 mL | Freq: Once | INTRAMUSCULAR | Status: AC | PRN
  Administered 2020-03-10: 125 mL via INTRAVENOUS

## 2020-03-10 MED ORDER — SODIUM CHLORIDE 0.9 % IV SOLN
1000.0000 mL | INTRAVENOUS | Status: DC
  Administered 2020-03-10: 1000 mL via INTRAVENOUS

## 2020-03-10 MED ORDER — METOCLOPRAMIDE HCL 5 MG/ML IJ SOLN
10.0000 mg | Freq: Once | INTRAMUSCULAR | Status: AC
  Administered 2020-03-10: 10 mg via INTRAVENOUS
  Filled 2020-03-10: qty 2

## 2020-03-10 NOTE — Telephone Encounter (Signed)
Pharmacy called to verify a prescription of zofran verified that zofran was ordered for the patient Tyrone Ballard.

## 2020-03-10 NOTE — ED Notes (Signed)
Pt returned from [place], complaining of abd pain.  Will inform MD

## 2020-03-10 NOTE — ED Provider Notes (Signed)
[facility] EMERGENCY DEPARTMENT Provider Note  CSN: [id number] Arrival date & time: 03/09/20 1033  Chief Complaint(s) Abdominal Pain and Emesis  HPI Tyrone Ballard is a 54 y.o. male who presents to the emergency department with epigastric abdominal discomfort and emesis.  Patient has a history of developmental delays and is accompanied by his mother who reports he has had this issue for several years.  This is almost a daily occurrence.  Reports that they have a poor diet.  No known suspicious food intake.  No recent fevers or chills.  No known sick contacts.  No associated chest pain or shortness of breath.  No diarrhea.  She does report a history of constipation.  Remainder of history, ROS, and physical exam limited due to patient's condition (developmental delay). Additional information was obtained from mother.   Level V Caveat.     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 Authorizing Provider  famotidine (PEPCID) 20 MG tablet Take 20 mg by mouth daily as needed for heartburn or indigestion (ZANTAC).   Yes [provider]  citalopram (CELEXA) 20 MG tablet Take 20 mg by mouth every morning.    [provider]  ondansetron (ZOFRAN ODT) 4 MG disintegrating tablet Take 1 tablet (4 mg total) by mouth every 8 (eight) hours as needed for up to 3 days for nausea or vomiting. 03/10/20 03/13/20  Leda Bellefeuille, Amadeo Garnet, MD  risperiDONE (RISPERDAL) 2 MG tablet Take 2 mg by mouth every morning.    [provider]  risperidone (RISPERDAL) 4 MG tablet Take 4 mg by mouth at bedtime.    [provider]                                                                                                                                     Past Surgical History Past Surgical History:  Procedure Laterality Date   HAND SURGERY     TOE SURGERY     Family History No family history on file.  Social History Social History   Tobacco Use   Smoking status: Current Every Day Smoker    Packs/day: 0.25    Years: 3.00    Pack years: 0.75    Types: Cigarettes   Smokeless tobacco: Never Used   Tobacco comment: Pt refused and refused patch. sys he smokes mabe 2-4 cigaretts per day  Substance Use Topics   Alcohol use: Yes    Comment: social   Drug use: Yes    Types: Marijuana, Cocaine    Comment: social only   Allergies Patient has no known allergies.  Review of Systems Review of Systems  Unable to  perform ROS: Other  Developmental delay   Physical Exam Vital Signs  I have reviewed the triage vital signs BP 103/62    Pulse 94    Temp 98.5 F (36.9 C)    Resp 14    Ht 6\' 2"  (1.88 m)    Wt 122.5 kg    SpO2 94%    BMI 34.67 kg/m   Physical Exam Vitals reviewed.  Constitutional:      General: He is not in acute distress.    Appearance: He is well-developed. He is not diaphoretic.  HENT:     Head: Normocephalic and atraumatic.     Nose: Nose normal.  Eyes:     General: No scleral icterus.       Right eye: No discharge.        Left eye: No discharge.     Conjunctiva/sclera: Conjunctivae normal.     Pupils: Pupils are equal, round, and reactive to light.  Cardiovascular:     Rate and Rhythm: Normal rate and regular rhythm.     Heart sounds: No murmur heard.  No friction rub. No gallop.   Pulmonary:     Effort: Pulmonary effort is normal. No respiratory distress.     Breath sounds: Normal breath sounds. No stridor. No rales.  Abdominal:     General: There is no distension.     Palpations: Abdomen is soft.     Tenderness: There is abdominal tenderness (mild discomfort). There is no guarding or rebound.  Musculoskeletal:        General: No  tenderness.     Cervical back: Normal range of motion and neck supple.  Skin:    General: Skin is warm and dry.     Findings: No erythema or rash.  Neurological:     Mental Status: He is alert and oriented to person, place, and time.     ED Results and Treatments Labs (all labs ordered are listed, but only abnormal results are displayed) Labs Reviewed  COMPREHENSIVE METABOLIC PANEL - Abnormal; Notable for the following components:      Result Value   Glucose, Bld 116 (*)    AST 89 (*)    ALT 122 (*)    All other components within normal limits  URINALYSIS, ROUTINE W REFLEX MICROSCOPIC - Abnormal; Notable for the following components:   Color, Urine AMBER (*)    APPearance HAZY (*)    Ketones, ur 20 (*)    Protein, ur 100 (*)    Bacteria, UA RARE (*)    All other components within normal limits  LIPASE, BLOOD  CBC                                                                                                                         EKG  EKG Interpretation  Date/Time:  Saturday March 10 2020 02:43:37 EDT Ventricular Rate:  106 PR Interval:    QRS Duration: 89 QT Interval:  340 QTC Calculation: 452  R Axis:   19 Text Interpretation: Sinus tachycardia No old tracing to compare Confirmed by Drema Pry [phone]) on 03/10/2020 3:01:35 AM      Radiology CT ABDOMEN PELVIS W CONTRAST  Result Date: 03/10/2020 CLINICAL DATA:  Evaluate for bowel obstruction. Nonlocalized abdominal pain. Emesis. EXAM: CT ABDOMEN AND PELVIS WITH CONTRAST TECHNIQUE: Multidetector CT imaging of the abdomen and pelvis was performed using the standard protocol following bolus administration of intravenous contrast. CONTRAST:  OMNIPAQUE IOHEXOL 300 MG/ML  SOLN COMPARISON:  None FINDINGS: Lower chest: No acute abnormality. Hepatobiliary: Hepatic steatosis suspected. There is no focal liver abnormality identified. Normal appearance of the gallbladder. No biliary ductal dilatation. Pancreas:  Unremarkable. No pancreatic ductal dilatation or surrounding inflammatory changes. Spleen: Normal in size without focal abnormality. Adrenals/Urinary Tract: Normal appearance of the adrenal glands. 1.2 cm. Exophytic cyst arises off the lateral aspect of the upper pole of left kidney. No hydronephrosis identified bilaterally. The urinary bladder is unremarkable. Stomach/Bowel: Stomach is nondistended. The appendix is visualized and appears normal. Scattered colonic diverticula noted. No bowel wall thickening, inflammation, or distension. Large volume of desiccated stool identified within the rectum. Vascular/Lymphatic: No significant vascular findings are present. No enlarged abdominal or pelvic lymph nodes. Reproductive: Prostate is unremarkable. Other: No abdominal wall hernia or abnormality. No abdominopelvic ascites. Musculoskeletal: Bilateral L4 and L5 pars defects identified. First degree anterolisthesis of L5 on S1 noted. Degenerative disc disease with vacuum disc noted at the L5-S1 disc space. IMPRESSION: 1. No acute findings identified within the abdomen or pelvis. No evidence for bowel obstruction. 2. Large volume of desiccated stool identified within the rectum. Correlate for any clinical signs or symptoms of constipation. 3. Bilateral L4 and L5 pars defects with grade 1 anterolisthesis of L5 on S1. 4. Hepatic steatosis. Electronically Signed   By: Signa Kell M.D.   On: 03/10/2020 04:53   US Abdomen Limited RUQ  Result Date: 03/10/2020 CLINICAL DATA:  Right upper quadrant pain EXAM: ULTRASOUND ABDOMEN LIMITED RIGHT UPPER QUADRANT COMPARISON:  None. FINDINGS: Gallbladder: No gallstones or wall thickening visualized. No sonographic Murphy sign noted by sonographer. Common bile duct: Diameter: 4 mm Liver: Increased parenchymal echogenicity compatible with hepatic steatosis. No focal liver abnormality. Left lobe of liver not assessed due to patient body habitus. Portal vein is patent on color Doppler  imaging with normal direction of blood flow towards the liver. Other: None. IMPRESSION: 1. No acute abnormality. 2. Hepatic steatosis. Electronically Signed   By: Signa Kell M.D.   On: 03/10/2020 06:11    Pertinent labs & imaging results that were available during my care of the patient were reviewed by me and considered in my medical decision making (see chart for details).  Medications Ordered in ED Medications  sodium chloride 0.9 % bolus 1,000 mL (0 mLs Intravenous Stopped 03/10/20 0337)    Followed by  0.9 %  sodium chloride infusion (0 mLs Intravenous Stopped 03/10/20 0746)  ondansetron (ZOFRAN) injection 4 mg (4 mg Intravenous Given 03/10/20 0306)  iohexol (OMNIPAQUE) 300 MG/ML solution 125 mL (125 mLs Intravenous Contrast Given 03/10/20 0325)  metoCLOPramide (REGLAN) injection 10 mg (10 mg Intravenous Given 03/10/20 0437)  fentaNYL (SUBLIMAZE) injection 50 mcg (50 mcg Intravenous Given 03/10/20 0436)  Procedures Procedures  (including critical care time)  Medical Decision Making / ED Course I have reviewed the nursing notes for this encounter and the patient's prior records (if available in EHR or on provided paperwork).   Venus Ruhe was evaluated in Emergency Department on 03/10/2020 for the symptoms described in the history of present illness. He was evaluated in the context of the global COVID-19 pandemic, which necessitated consideration that the patient might be at risk for infection with the SARS-CoV-2 virus that causes COVID-19. Institutional protocols and algorithms that pertain to the evaluation of patients at risk for COVID-19 are in a state of rapid change based on information released by regulatory bodies including the CDC and federal and state organizations. These policies and algorithms were followed during the patient's care in the ED.  Labs  are grossly reassuring without leukocytosis.  No significant electrolyte derangement or renal sufficiency.  There is evidence of mild transaminitis.  Given difficulty with history.  CT scan and right upper quadrant were obtained which were negative.  Patient was treated symptomatically and given IV fluids.  He was able to tolerate p.o. intake and felt to be stable for discharge.      Final Clinical Impression(s) / ED Diagnoses Final diagnoses:  RUQ pain  Nausea and vomiting in adult   The patient appears reasonably screened and/or stabilized for discharge and I doubt any other medical condition or other [facility] requiring further screening, evaluation, or treatment in the ED at this time prior to discharge. Safe for discharge with strict return precautions.  Disposition: Discharge  Condition: Good  I have discussed the results, Dx and Tx plan with the patient/family who expressed understanding and agree(s) with the plan. Discharge instructions discussed at length. The patient/family was given strict return precautions who verbalized understanding of the instructions. No further questions at time of discharge.    ED Discharge Orders         Ordered    ondansetron (ZOFRAN ODT) 4 MG disintegrating tablet  Every 8 hours PRN        03/10/20 0746            Follow Up: Primary care provider  Schedule an appointment as soon as possible for a visit        This chart was dictated using voice recognition software.  Despite best efforts to proofread,  errors can occur which can change the documentation meaning.   Nira Conn, MD 03/10/20 [phone]

## 2020-03-10 NOTE — ED Notes (Signed)
Patient transported to Ultrasound 

## 2021-12-24 IMAGING — US US ABDOMEN LIMITED
1 series · 14 of 25 positions shown · non-contrast
Comparison: None.

CLINICAL DATA: Right upper quadrant pain

EXAM:
ULTRASOUND ABDOMEN LIMITED RIGHT UPPER QUADRANT

[Series 1: us abdomen limited ruq · 14 of 103 slices shown]
[im 1/103]
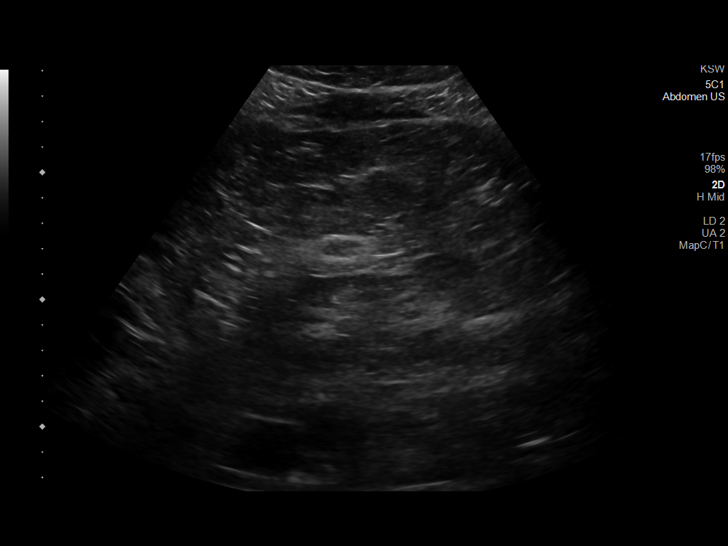
[im 9/103]
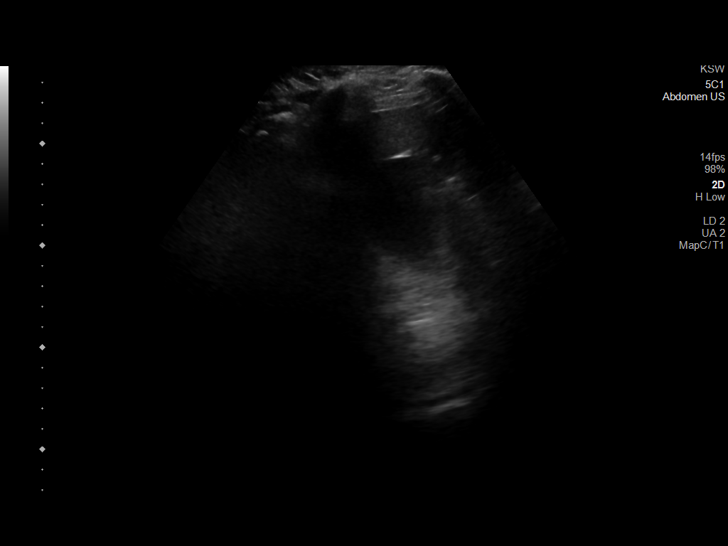
[im 18/103]
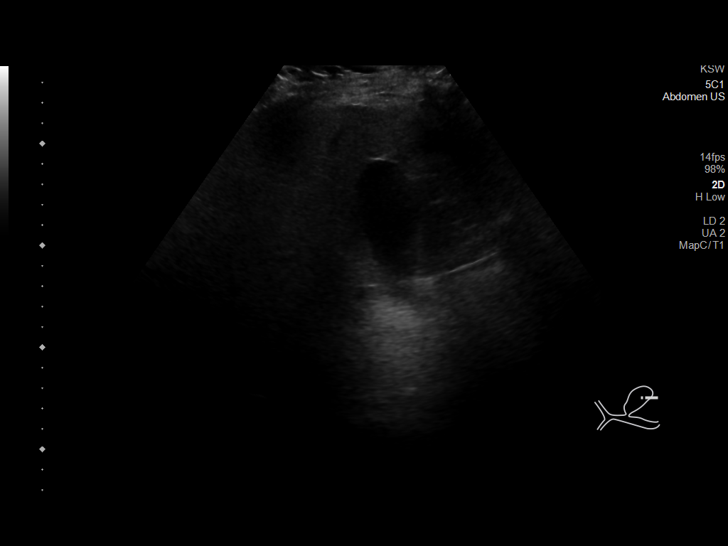
[im 26/103]
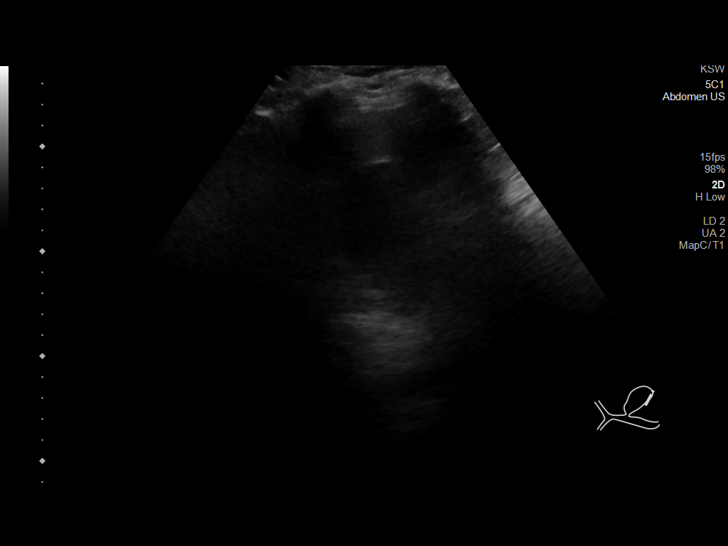
[im 35/103]
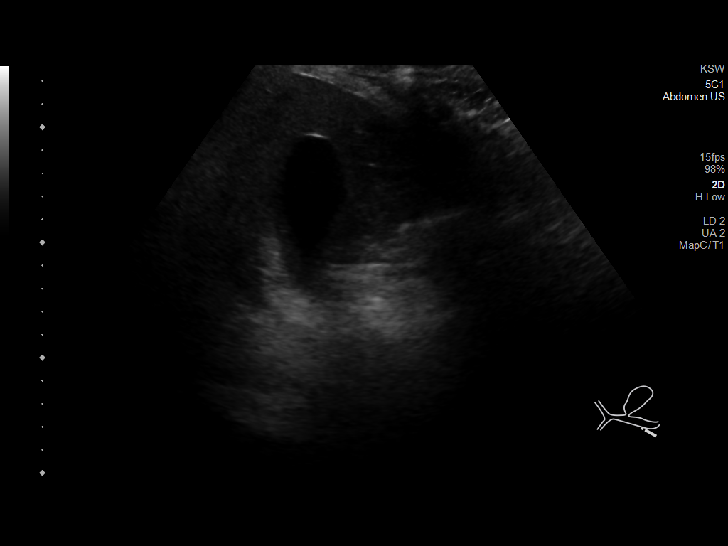
[im 39/103]
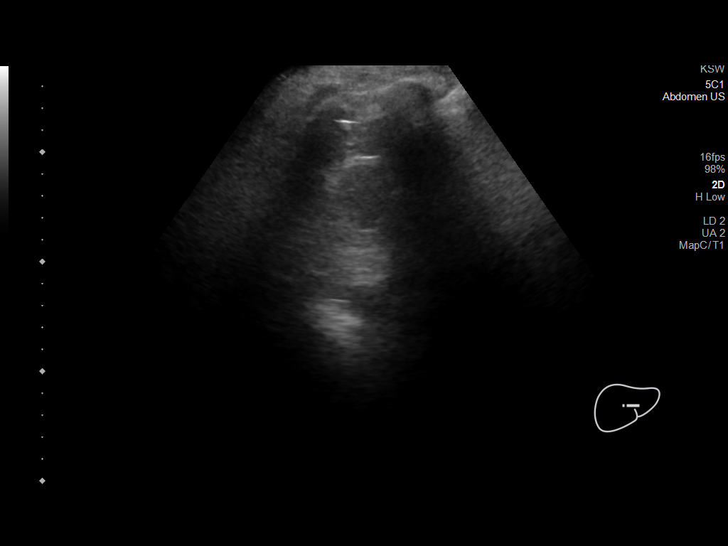
[im 47/103]
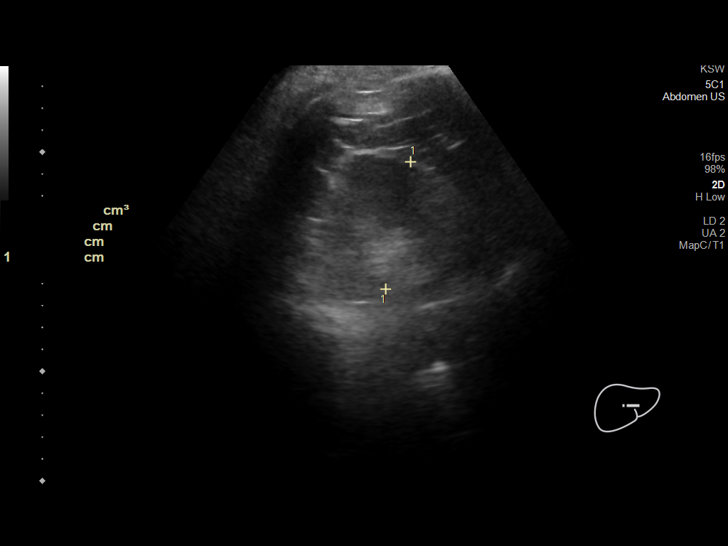
[im 56/103]
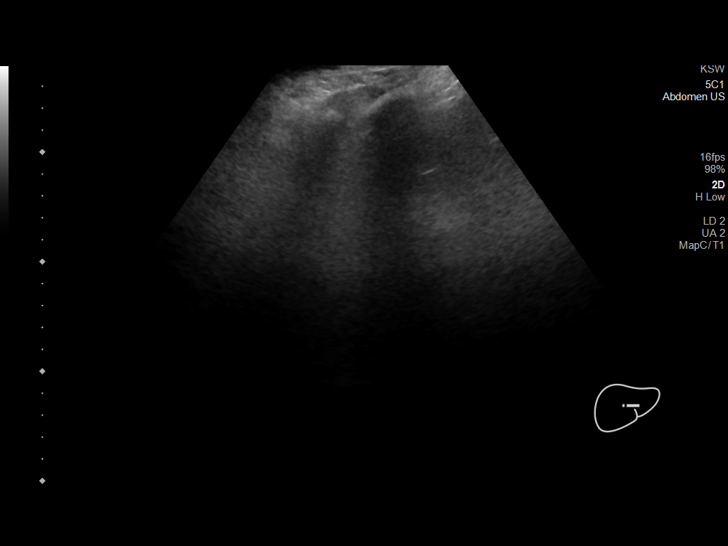
[im 64/103]
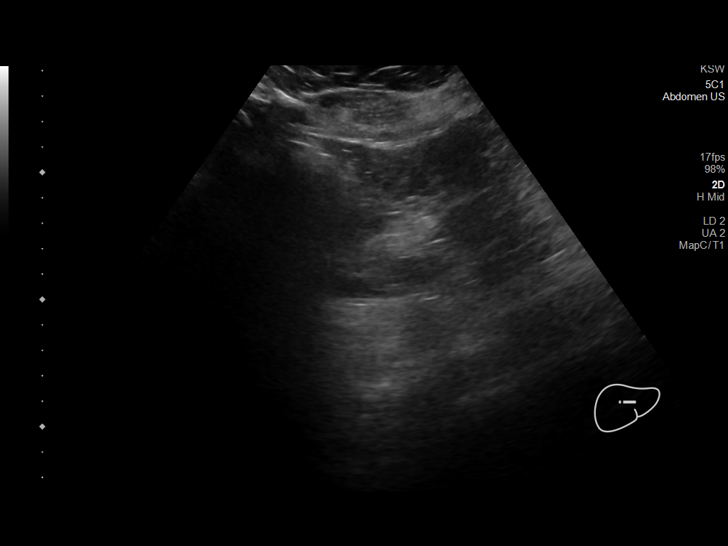
[im 69/103]
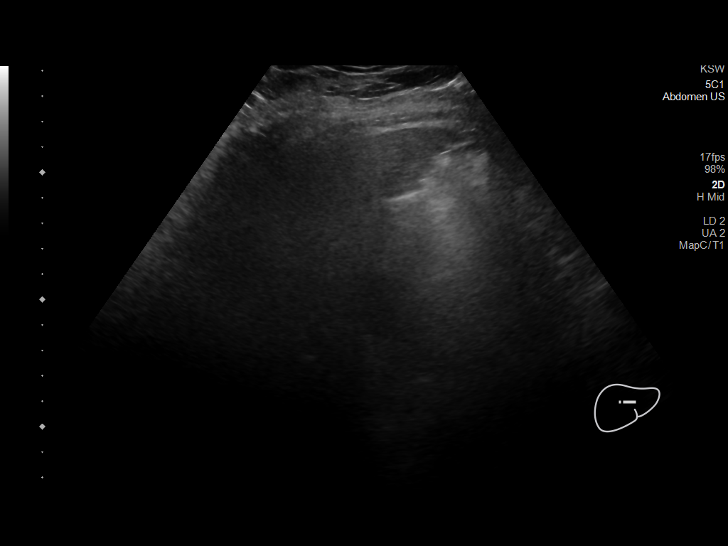
[im 77/103]
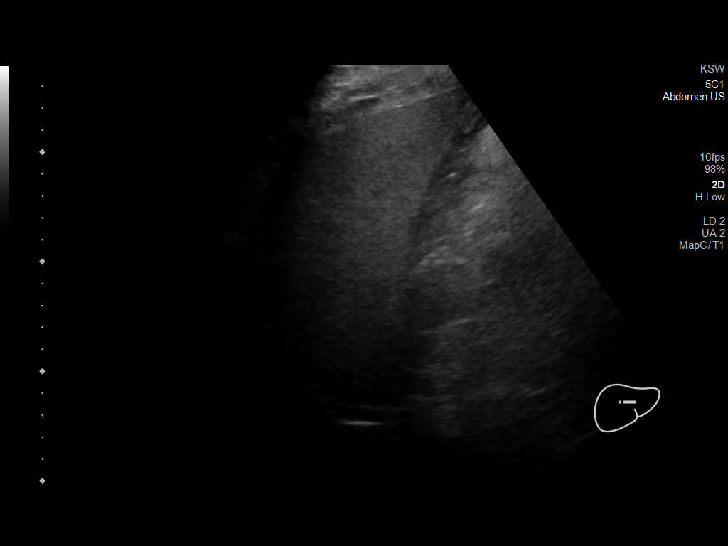
[im 86/103]
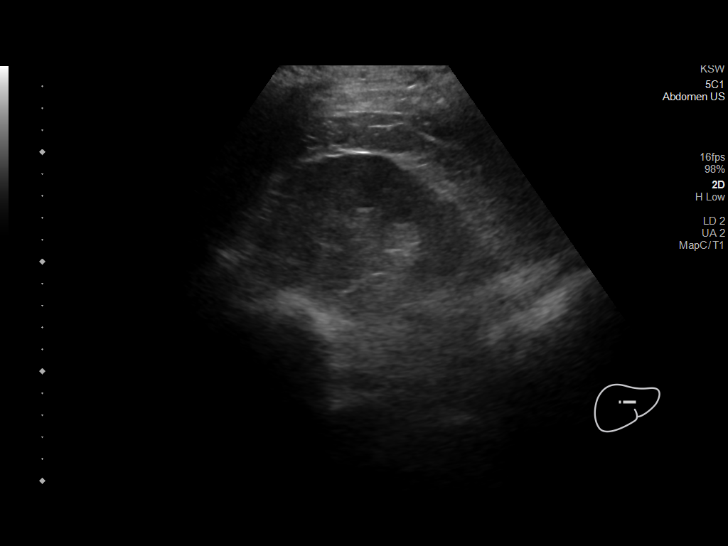
[im 94/103]
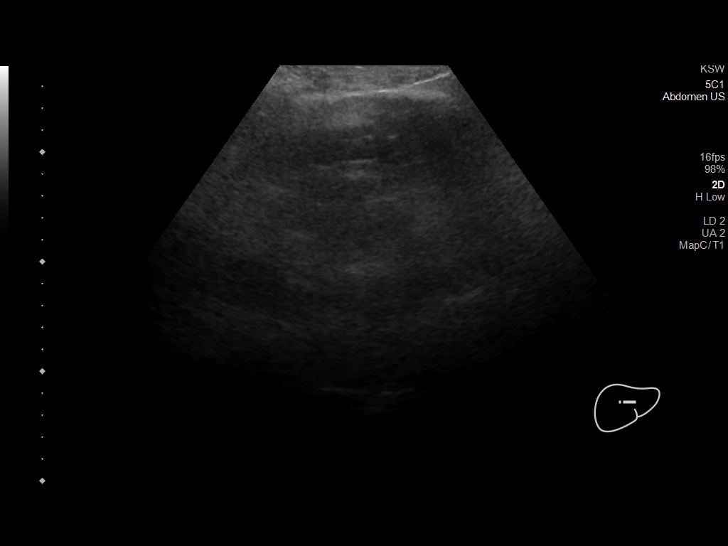
[im 103/103]
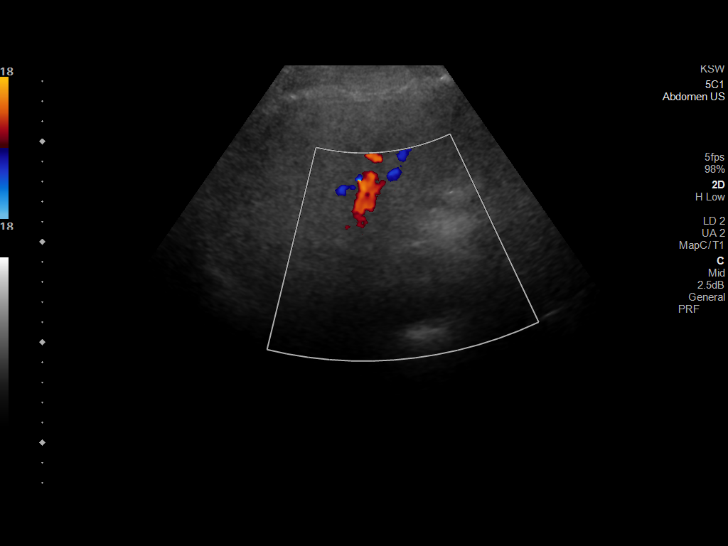

[14 of 25 positions shown; findings below may reference images not displayed]

FINDINGS: Gallbladder:

No gallstones or wall thickening visualized. No sonographic Murphy
sign noted by sonographer.

Common bile duct:

Diameter: 4 mm

Liver:

Increased parenchymal echogenicity compatible with hepatic
steatosis. No focal liver abnormality. Left lobe of liver not
assessed due to patient body habitus. Portal vein is patent on color
Doppler imaging with normal direction of blood flow towards the
liver.

Other: None.
IMPRESSION: 1. No acute abnormality.
2. Hepatic steatosis.

## 2021-12-24 IMAGING — CT CT ABD-PELV W/ CM
2 of 5 series · 16 of 46 positions shown, 18 images · IV contrast (Omni 300)
Comparison: None

CLINICAL DATA: Evaluate for bowel obstruction. Nonlocalized
abdominal pain. Emesis.

EXAM:
CT ABDOMEN AND PELVIS WITH CONTRAST
TECHNIQUE: Multidetector CT imaging of the abdomen and pelvis was performed
using the standard protocol following bolus administration of
intravenous contrast.
CONTRAST:  125mL OMNIPAQUE IOHEXOL 300 MG/ML  SOLN

[Series 3: a/p w/ 5mm · axial · 0.94mm/px · z∈[-635,-140]mm · 13 of 113 slices shown, 15 images]
[im 7/113  soft-tissue]
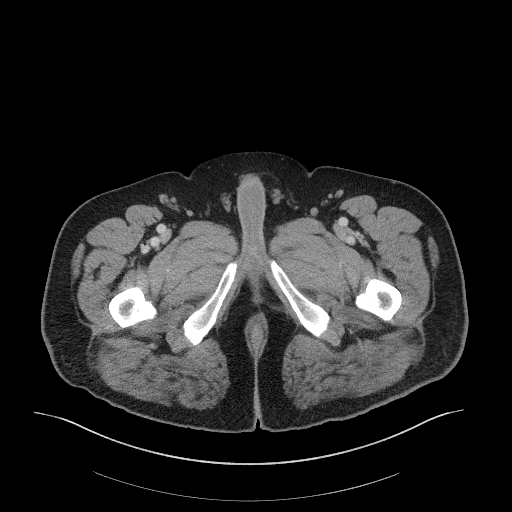
[im 7/113  bone]
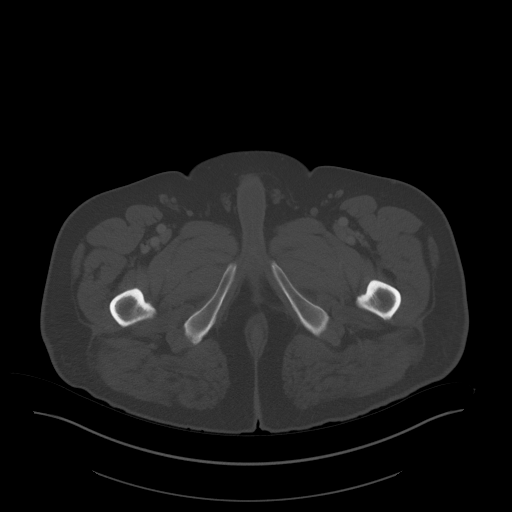
[im 14/113  soft-tissue]
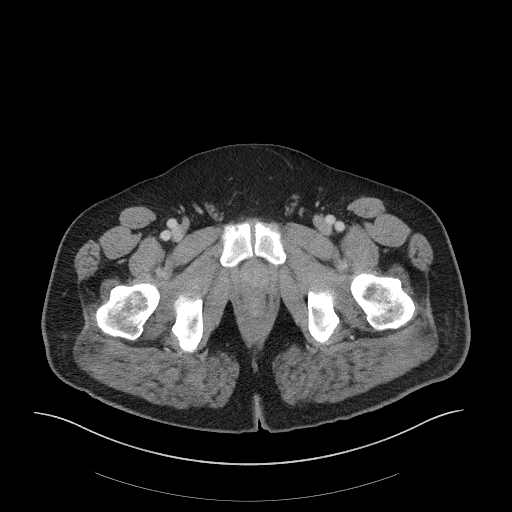
[im 27/113  soft-tissue]
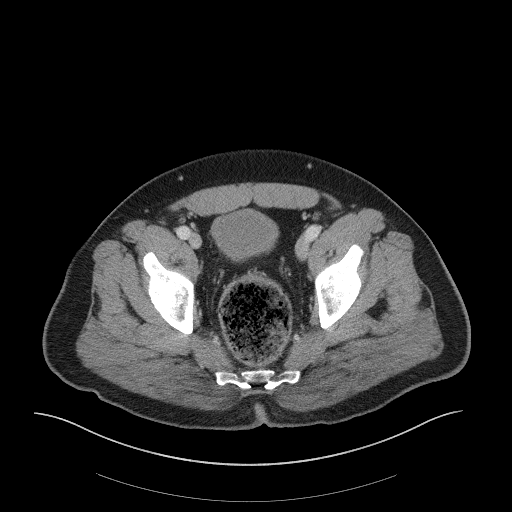
[im 33/113  soft-tissue]
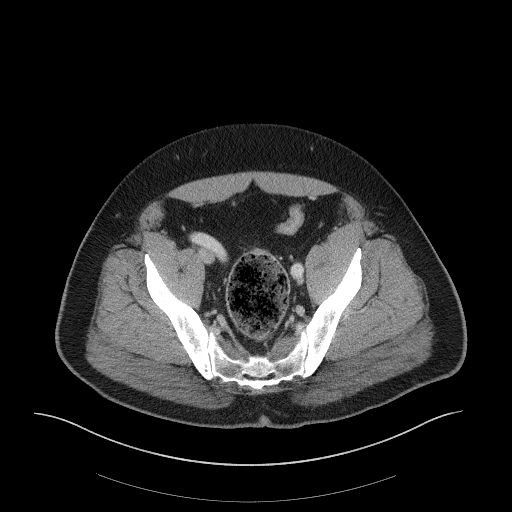
[im 40/113  soft-tissue]
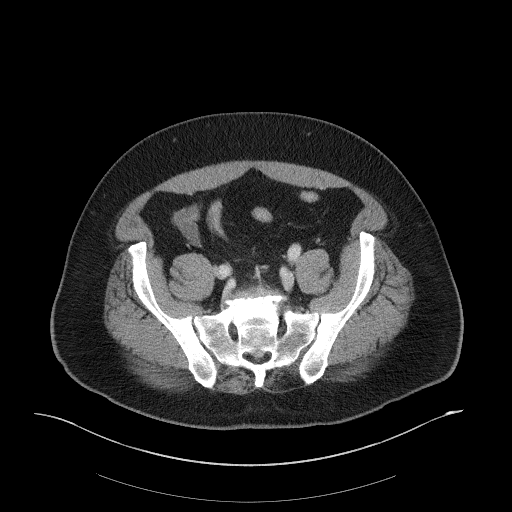
[im 47/113  soft-tissue]
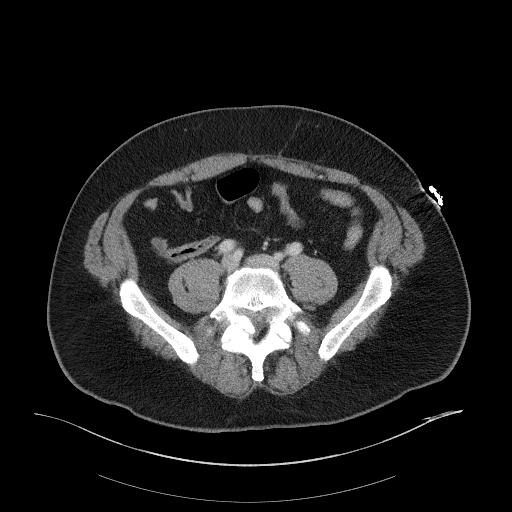
[im 60/113  soft-tissue]
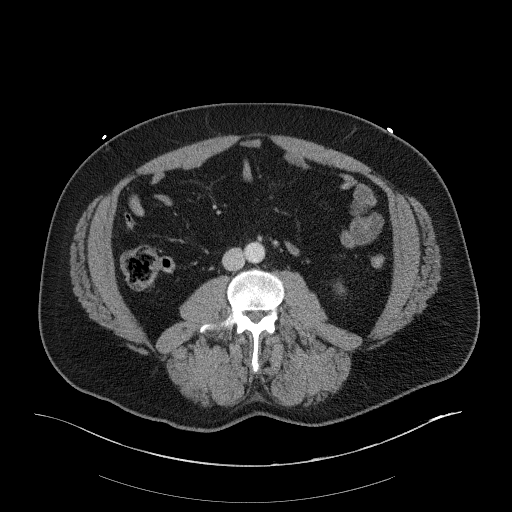
[im 66/113  soft-tissue]
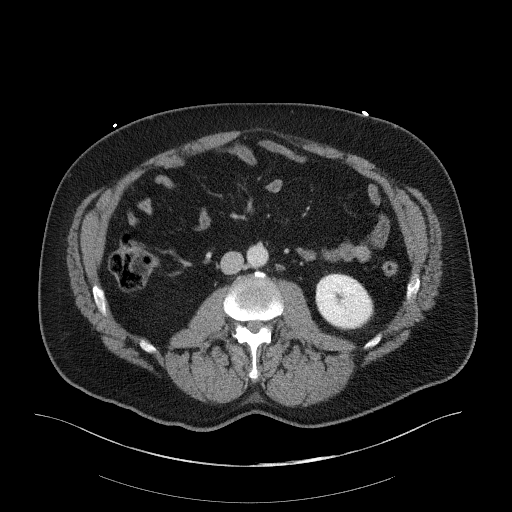
[im 73/113  soft-tissue]
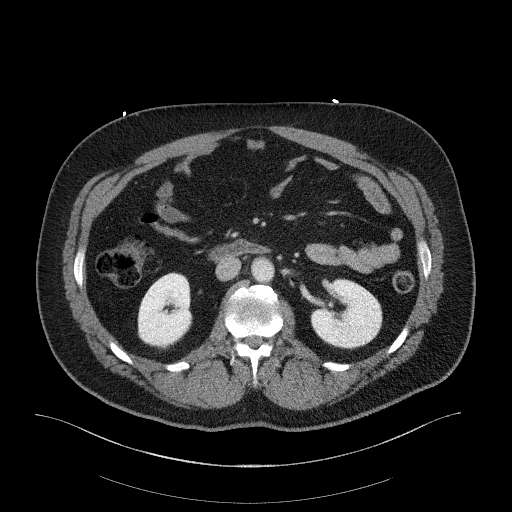
[im 73/113  bone]
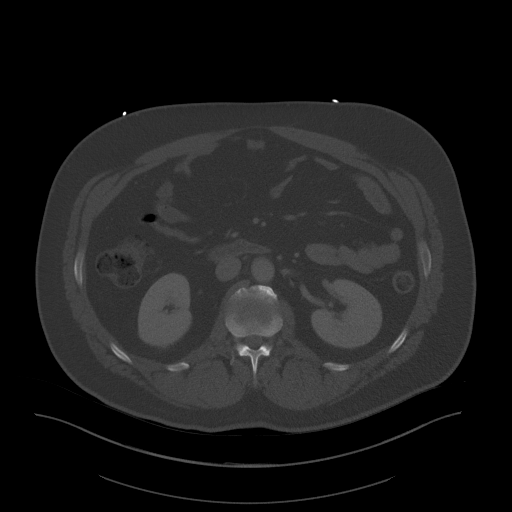
[im 80/113  soft-tissue]
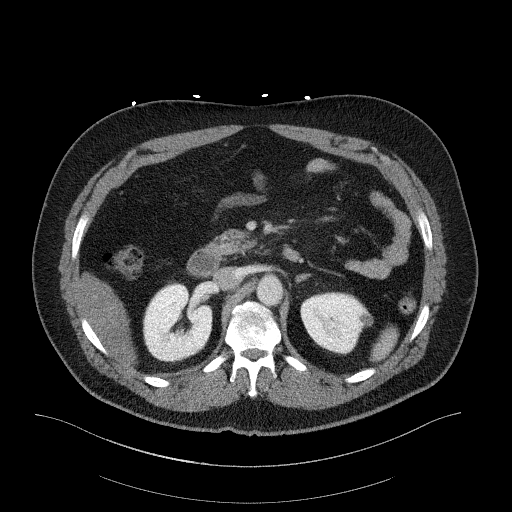
[im 86/113  soft-tissue]
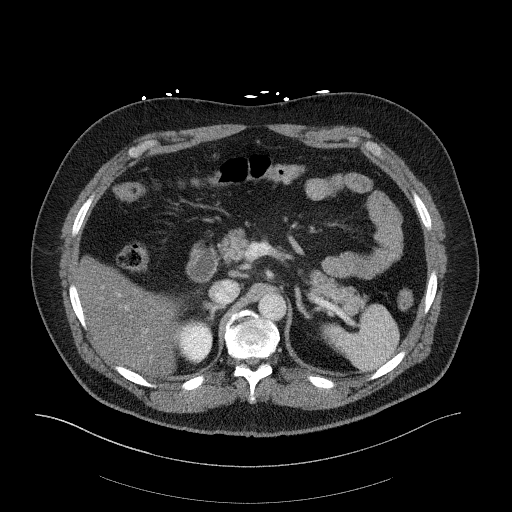
[im 99/113  soft-tissue]
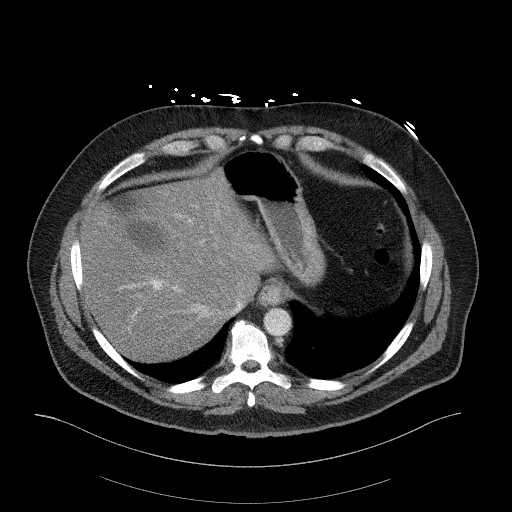
[im 106/113  soft-tissue]
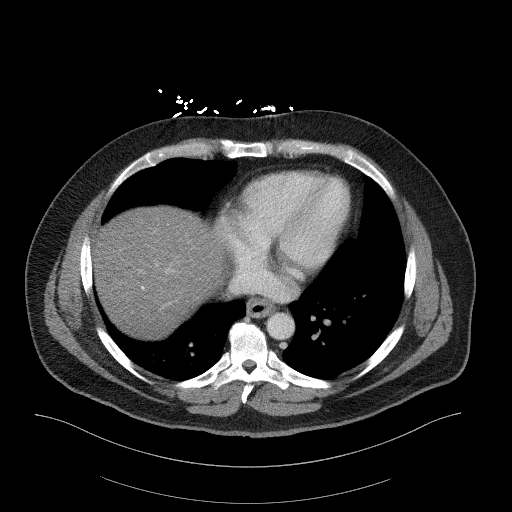

[Series 6: a/p w/ cor · coronal · 0.87mm/px · 3 of 151 slices shown]
[im 51/151  soft-tissue]
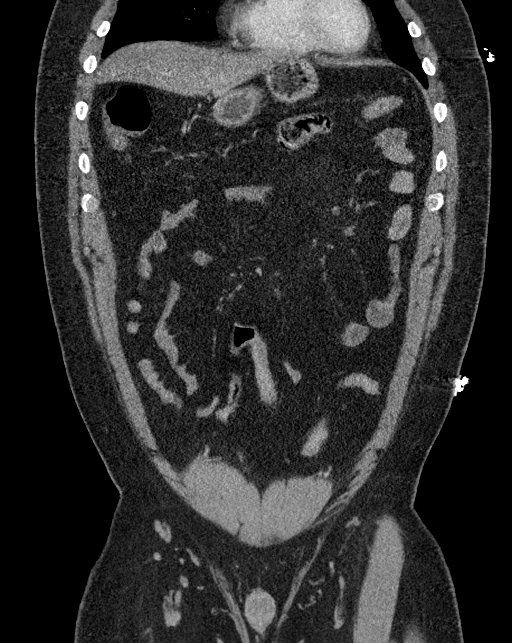
[im 67/151  soft-tissue]
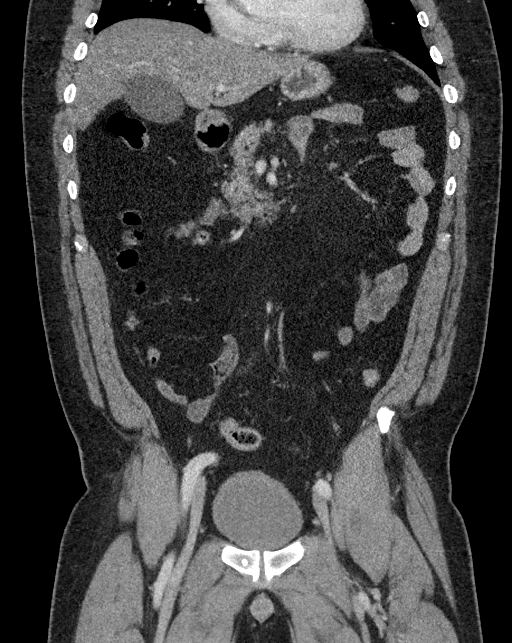
[im 84/151  soft-tissue]
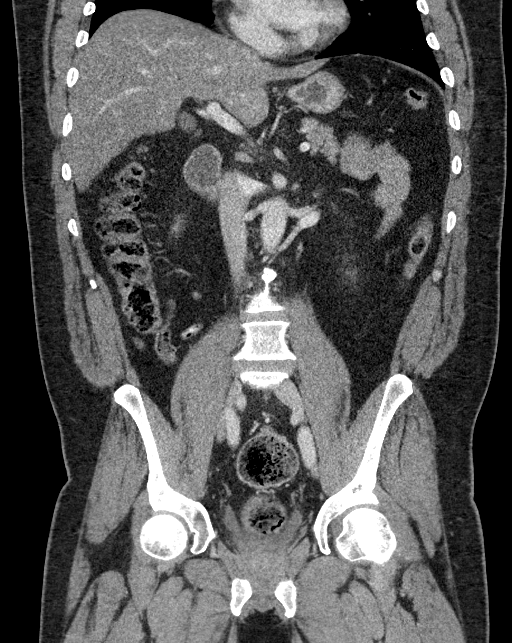

[16 of 46 positions shown; findings below may reference images not displayed]

FINDINGS: Lower chest: No acute abnormality.

Hepatobiliary: Hepatic steatosis suspected. There is no focal liver
abnormality identified. Normal appearance of the gallbladder. No
biliary ductal dilatation.

Pancreas: Unremarkable. No pancreatic ductal dilatation or
surrounding inflammatory changes.

Spleen: Normal in size without focal abnormality.

Adrenals/Urinary Tract: Normal appearance of the adrenal glands.
cm. Exophytic cyst arises off the lateral aspect of the upper pole
of left kidney. No hydronephrosis identified bilaterally. The
urinary bladder is unremarkable.

Stomach/Bowel: Stomach is nondistended. The appendix is visualized
and appears normal. Scattered colonic diverticula noted. No bowel
wall thickening, inflammation, or distension. Large volume of
desiccated stool identified within the rectum.

Vascular/Lymphatic: No significant vascular findings are present. No
enlarged abdominal or pelvic lymph nodes.

Reproductive: Prostate is unremarkable.

Other: No abdominal wall hernia or abnormality. No abdominopelvic
ascites.

Musculoskeletal: Bilateral L4 and L5 pars defects identified. First
degree anterolisthesis of L5 on S1 noted. Degenerative disc disease
with vacuum disc noted at the L5-S1 disc space.
IMPRESSION: 1. No acute findings identified within the abdomen or pelvis. No
evidence for bowel obstruction.
2. Large volume of desiccated stool identified within the rectum.
Correlate for any clinical signs or symptoms of constipation.
3. Bilateral L4 and L5 pars defects with grade 1 anterolisthesis of
L5 on S1.
4. Hepatic steatosis.
